# Patient Record
Sex: Male | Born: 1935 | Race: White | Hispanic: No | Marital: Married
Health system: Southern US, Community
[De-identification: ages and names within clinical notes are randomized; demographics above are authoritative.]

---

## 2005-07-13 ENCOUNTER — Ambulatory Visit: Payer: Self-pay | Admitting: Internal Medicine

## 2005-12-03 ENCOUNTER — Ambulatory Visit: Payer: Self-pay | Admitting: Internal Medicine

## 2007-09-25 ENCOUNTER — Other Ambulatory Visit: Payer: Self-pay

## 2007-09-25 ENCOUNTER — Ambulatory Visit: Payer: Self-pay | Admitting: Ophthalmology

## 2007-10-02 ENCOUNTER — Ambulatory Visit: Payer: Self-pay | Admitting: Ophthalmology

## 2008-02-25 ENCOUNTER — Other Ambulatory Visit: Payer: Self-pay

## 2008-02-25 ENCOUNTER — Emergency Department: Payer: Self-pay | Admitting: Emergency Medicine

## 2008-03-11 ENCOUNTER — Ambulatory Visit: Payer: Self-pay | Admitting: Internal Medicine

## 2008-10-24 ENCOUNTER — Ambulatory Visit: Payer: Self-pay | Admitting: Urology

## 2008-10-30 ENCOUNTER — Ambulatory Visit: Payer: Self-pay | Admitting: Urology

## 2008-10-31 ENCOUNTER — Ambulatory Visit: Payer: Self-pay | Admitting: Urology

## 2010-06-23 ENCOUNTER — Ambulatory Visit: Payer: Self-pay | Admitting: Internal Medicine

## 2010-07-05 ENCOUNTER — Ambulatory Visit: Payer: Self-pay | Admitting: Internal Medicine

## 2010-07-21 ENCOUNTER — Ambulatory Visit: Payer: Self-pay | Admitting: Pain Medicine

## 2010-08-10 ENCOUNTER — Ambulatory Visit: Payer: Self-pay | Admitting: Pain Medicine

## 2010-08-18 ENCOUNTER — Ambulatory Visit: Payer: Self-pay | Admitting: Pain Medicine

## 2010-08-24 ENCOUNTER — Ambulatory Visit: Payer: Self-pay | Admitting: Internal Medicine

## 2010-09-01 ENCOUNTER — Ambulatory Visit: Payer: Self-pay | Admitting: Pain Medicine

## 2010-09-14 ENCOUNTER — Ambulatory Visit: Payer: Self-pay | Admitting: Pain Medicine

## 2011-01-29 ENCOUNTER — Other Ambulatory Visit (HOSPITAL_COMMUNITY): Payer: Self-pay | Admitting: Neurological Surgery

## 2011-01-29 ENCOUNTER — Ambulatory Visit (HOSPITAL_COMMUNITY)
Admission: RE | Admit: 2011-01-29 | Discharge: 2011-01-29 | Disposition: A | Discharge status: Home / Self Care | Payer: Medicare Other | Source: Ambulatory Visit | Attending: Neurological Surgery | Admitting: Neurological Surgery

## 2011-01-29 ENCOUNTER — Encounter (HOSPITAL_COMMUNITY)
Admission: RE | Admit: 2011-01-29 | Discharge: 2011-01-29 | Disposition: A | Discharge status: Home / Self Care | Payer: Medicare Other | Source: Ambulatory Visit | Attending: Neurological Surgery | Admitting: Neurological Surgery

## 2011-01-29 DIAGNOSIS — Z01812 Encounter for preprocedural laboratory examination: Secondary | ICD-10-CM | POA: Insufficient documentation

## 2011-01-29 DIAGNOSIS — Z01818 Encounter for other preprocedural examination: Secondary | ICD-10-CM | POA: Insufficient documentation

## 2011-01-29 DIAGNOSIS — M48061 Spinal stenosis, lumbar region without neurogenic claudication: Secondary | ICD-10-CM

## 2011-01-29 DIAGNOSIS — Z01810 Encounter for preprocedural cardiovascular examination: Secondary | ICD-10-CM | POA: Insufficient documentation

## 2011-01-29 LAB — BASIC METABOLIC PANEL
CO2: 32 mEq/L (ref 19–32)
Chloride: 103 mEq/L (ref 96–112)
Potassium: 4.4 mEq/L (ref 3.5–5.1)
Sodium: 143 mEq/L (ref 135–145)

## 2011-01-29 LAB — CBC
HCT: 42.6 % (ref 39.0–52.0)
Hemoglobin: 14.5 g/dL (ref 13.0–17.0)
MCV: 89.9 fL (ref 78.0–100.0)
RDW: 12.7 % (ref 11.5–15.5)
WBC: 7.4 10*3/uL (ref 4.0–10.5)

## 2011-01-29 LAB — PROTIME-INR: INR: 1.04 (ref 0.00–1.49)

## 2011-01-29 LAB — SURGICAL PCR SCREEN
MRSA, PCR: NEGATIVE
Staphylococcus aureus: NEGATIVE

## 2011-01-29 LAB — DIFFERENTIAL
Eosinophils Relative: 2 % (ref 0–5)
Lymphocytes Relative: 27 % (ref 12–46)
Lymphs Abs: 2 10*3/uL (ref 0.7–4.0)
Monocytes Absolute: 1 10*3/uL (ref 0.1–1.0)
Neutro Abs: 4.3 10*3/uL (ref 1.7–7.7)

## 2011-01-29 LAB — APTT: aPTT: 30 seconds (ref 24–37)

## 2011-02-10 ENCOUNTER — Inpatient Hospital Stay (HOSPITAL_COMMUNITY)
Admission: RE | Admit: 2011-02-10 | Discharge: 2011-02-11 | DRG: 491 | Disposition: A | Discharge status: Home / Self Care | Payer: Medicare Other | Source: Ambulatory Visit | Attending: Neurological Surgery | Admitting: Neurological Surgery

## 2011-02-10 ENCOUNTER — Observation Stay (HOSPITAL_COMMUNITY): Payer: Medicare Other

## 2011-02-10 DIAGNOSIS — I10 Essential (primary) hypertension: Secondary | ICD-10-CM | POA: Diagnosis present

## 2011-02-10 DIAGNOSIS — M48061 Spinal stenosis, lumbar region without neurogenic claudication: Principal | ICD-10-CM | POA: Diagnosis present

## 2011-02-10 DIAGNOSIS — Z01812 Encounter for preprocedural laboratory examination: Secondary | ICD-10-CM

## 2011-02-10 DIAGNOSIS — E119 Type 2 diabetes mellitus without complications: Secondary | ICD-10-CM | POA: Diagnosis present

## 2011-02-10 DIAGNOSIS — Z01810 Encounter for preprocedural cardiovascular examination: Secondary | ICD-10-CM

## 2011-02-22 NOTE — Op Note (Signed)
NAMELITTLE, Johnathan NO.:  [id number]  MEDICAL RECORD NO.:  [id number]  LOCATION:  3533                         FACILITY:  [facility]  PHYSICIAN:  Tia Alert, MD     DATE OF BIRTH:  Jan 21, 1930  DATE OF PROCEDURE:  02/10/2011 DATE OF DISCHARGE:  01/29/2011                              OPERATIVE REPORT   PREOPERATIVE DIAGNOSIS:  Severe lumbar spinal stenosis at L2-L3, L3-L4, L4-L5 with leg pain and weakness.  POSTOPERATIVE DIAGNOSES:  Severe lumbar spinal stenosis at L2-L3, L3-L4, L4-L5 with leg pain and weakness.  PROCEDURES:  Decompressive lumbar laminectomy, bilateral medial facetectomies and foraminotomies at L2-L3, L3-L4, L4-5 for decompression of the L3, L4, L5 nerve roots and central canal decompression.  SURGEON:  Tia Alert, M.D.  ASSISTANT:  Donalee Citrin, MD  ANESTHESIA:  General endotracheal.  COMPLICATIONS:  None apparent.  INDICATIONS FOR PROCEDURE:  Johnathan Perkins is a 77 year old gentleman whopresented with back pain radiating into the right leg with bilateral leg weakness.  He had a history of lumbar spinal stenosis.  He had an MRI, which showed severe spinal stenosis at L2-L3, L3-L4, L4-L5.  I recommended a decompressive laminectomy at L2-L3, L3-L4, L4-L5 in hopes of improving his pain syndrome and weakness.  He understood the risks, benefits, and expected outcome and wished to proceed.  DESCRIPTION OF PROCEDURE:  The patient was taken to the operating room and after induction of adequate generalized endotracheal anesthesia, the patient was rolled into the prone position on Wilson frame and all pressure points were padded.  His lumbar region was prepped with DuraPrep and draped in the usual sterile fashion.  An 8 mL of local anesthesia injected.  A dorsal midline incision made and carried down to lumbosacral fascia.  The fascia was opened and the paraspinous musculature was taken in subperiosteal fashion to expose the L2-L3, L3- L4,  L4-5.  Intraoperative x-ray confirmed my level and then I removed the spinous processes of L3-L4 as well as the inferior part of the spinous process of L2.  I then used a combination of high-speed drill and Kerrison punches to perform complete laminectomies, medial facetectomies and foraminotomies at L3-L4 with partial laminectomy bilaterally at L2.  I used Kerrison punch to undercut the lateral recesses to remove the yellow ligament from the lateral recesses until I was lateral to the dura.  I identified the medial pedicle wall and skeletonized the pedicles of L3, L4, L5, identified the nerve roots and decompressed them distally into their respective foramina undercutting the lateral recess to remove yellow ligament and overgrown facet from the surface of the nerve roots.  Once my decompression was complete, the central canal was well decompressed.  I could pass the coronary dilator easily into the lateral recess along the nerve roots.  I felt like I had adequate decompression.  Then based on visualization and palpation, I irrigated with saline solution and bacitracin.  Dried all bleeding points with bipolar cautery and Surgifoam.  We irrigated this away, lined the dura with Gelfoam, placed a medium Hemovac drain through a separate stab incision then closed the muscle and fascia with 0 Vicryl closing subcutaneous and subcuticular tissues with 2-0 and 3-0 Vicryl  and closed the skin with Benzoin and Steri-Strips.  The drapes were removed.  Sterile dressing was applied.  The patient awakened from anesthesia and transferred recovery room in stable condition.  At the end of the procedure, all sponge, needle, and instrument counts were correct.     Tia Alert, MD     DSJ/MEDQ  D:  02/10/2011  T:  02/10/2011  Job:  [id number]  Electronically Signed by Marikay Alar MD on 02/22/2011 01:13:26 PM

## 2011-07-05 ENCOUNTER — Ambulatory Visit: Payer: Self-pay | Admitting: General Practice

## 2011-07-05 LAB — URINALYSIS, COMPLETE
Bilirubin,UR: NEGATIVE
Blood: NEGATIVE
Ketone: NEGATIVE
Ph: 5 (ref 4.5–8.0)
RBC,UR: <1 /HPF (ref 0–5)
Specific Gravity: 1.026 (ref 1.003–1.030)
Squamous Epithelial: NONE SEEN

## 2011-07-05 LAB — BASIC METABOLIC PANEL
Calcium, Total: 9.2 mg/dL (ref 8.5–10.1)
Creatinine: 0.93 mg/dL (ref 0.60–1.30)
EGFR (Non-African Amer.): >60
Glucose: 113 mg/dL — ABNORMAL HIGH (ref 65–99)
Sodium: 145 mmol/L (ref 136–145)

## 2011-07-05 LAB — APTT: Activated PTT: 30.7 secs (ref 23.6–35.9)

## 2011-07-05 LAB — SEDIMENTATION RATE: Erythrocyte Sed Rate: 6 mm/hr (ref 0–20)

## 2011-07-05 LAB — CBC
HCT: 40.5 % (ref 40.0–52.0)
MCHC: 33.5 g/dL (ref 32.0–36.0)
RDW: 14.2 % (ref 11.5–14.5)

## 2011-07-05 LAB — PROTIME-INR: INR: 1

## 2011-07-21 ENCOUNTER — Inpatient Hospital Stay: Payer: Self-pay | Admitting: General Practice

## 2011-07-22 LAB — BASIC METABOLIC PANEL
Calcium, Total: 7.9 mg/dL — ABNORMAL LOW (ref 8.5–10.1)
Co2: 25 mmol/L (ref 21–32)
EGFR (African American): >60
EGFR (Non-African Amer.): >60
Glucose: 132 mg/dL — ABNORMAL HIGH (ref 65–99)
Osmolality: 281 (ref 275–301)
Potassium: 5 mmol/L (ref 3.5–5.1)
Sodium: 138 mmol/L (ref 136–145)

## 2011-07-22 LAB — HEMOGLOBIN: HGB: 11 g/dL — ABNORMAL LOW (ref 13.0–18.0)

## 2011-07-23 LAB — BASIC METABOLIC PANEL
Anion Gap: 10 (ref 7–16)
BUN: 17 mg/dL (ref 7–18)
Creatinine: 1.09 mg/dL (ref 0.60–1.30)
EGFR (Non-African Amer.): >60
Glucose: 146 mg/dL — ABNORMAL HIGH (ref 65–99)
Osmolality: 287 (ref 275–301)
Potassium: 4.1 mmol/L (ref 3.5–5.1)

## 2011-07-23 LAB — PLATELET COUNT: Platelet: 152 10*3/uL (ref 150–440)

## 2012-06-07 ENCOUNTER — Ambulatory Visit: Payer: Self-pay | Admitting: Ophthalmology

## 2012-06-19 ENCOUNTER — Ambulatory Visit: Payer: Self-pay | Admitting: Ophthalmology

## 2012-07-25 ENCOUNTER — Ambulatory Visit: Payer: Self-pay | Admitting: Gastroenterology

## 2012-10-29 IMAGING — CT CT ABDOMEN W/ CM
1 of 2 series · 15 of 32 positions shown, 19 images · non-contrast
Comparison: none

REASON FOR EXAM: Wt Loss
COMMENTS:

[Series 2: abd with 5.0 i40f · axial · 0.80mm/px · z∈[-915,-690]mm · 15 of 51 slices shown, 19 images]
[im 3/51  soft-tissue]
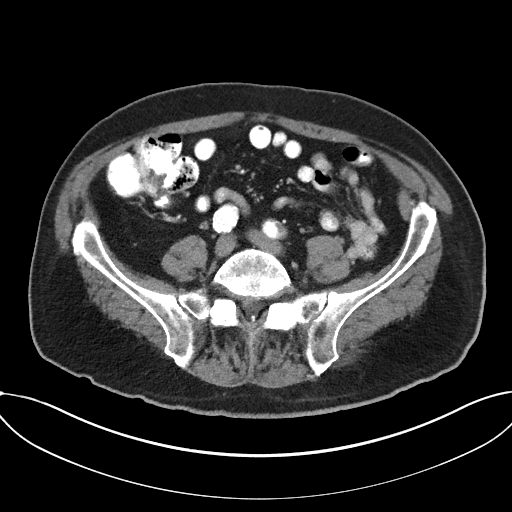
[im 3/51  bone]
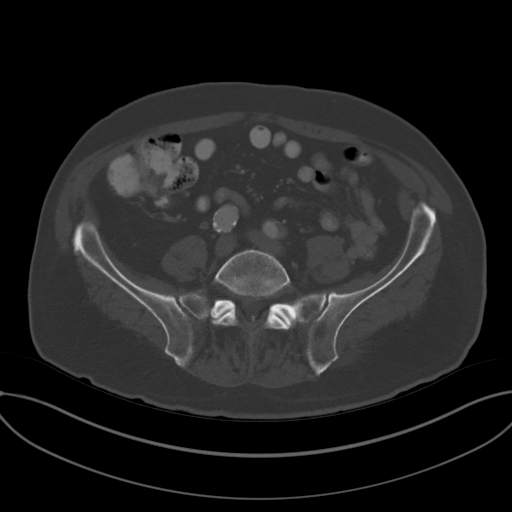
[im 7/51  soft-tissue]
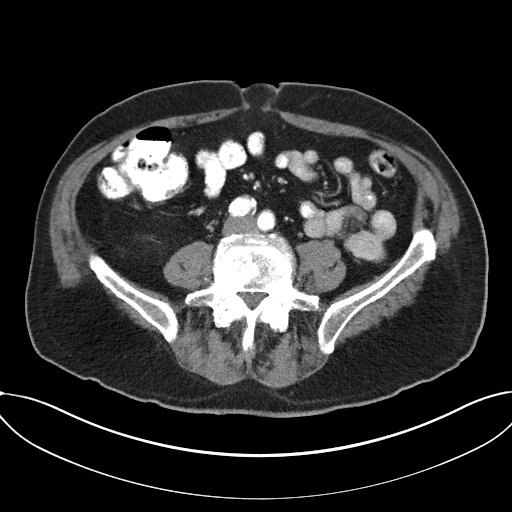
[im 11/51  soft-tissue]
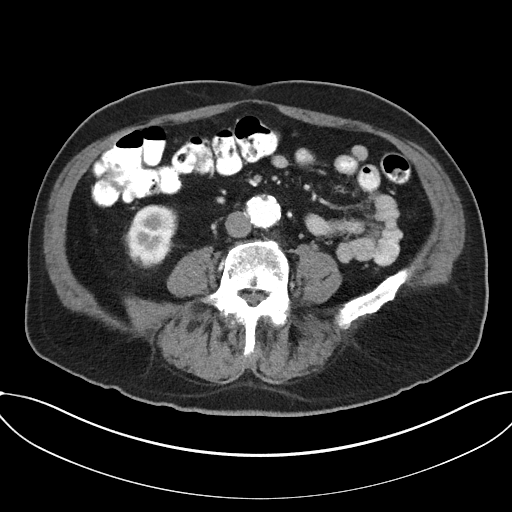
[im 14/51  soft-tissue]
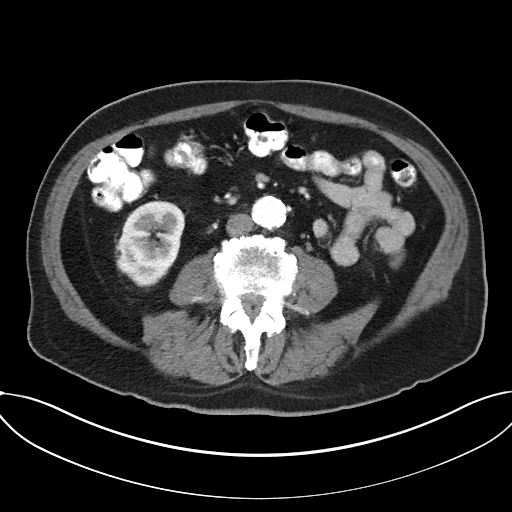
[im 18/51  soft-tissue]
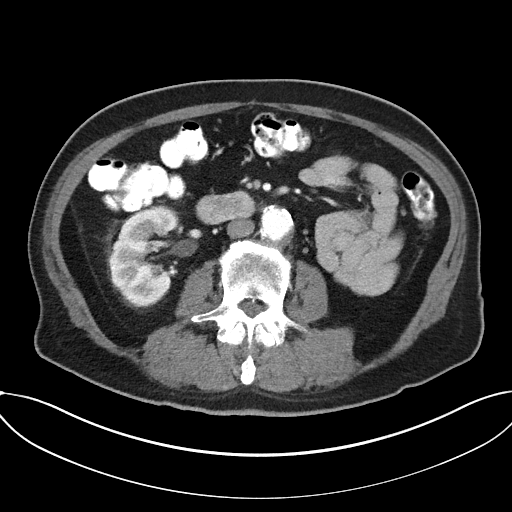
[im 22/51  soft-tissue]
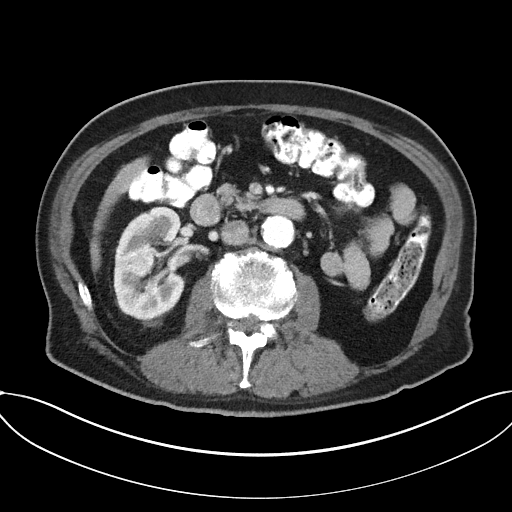
[im 27/51  soft-tissue]
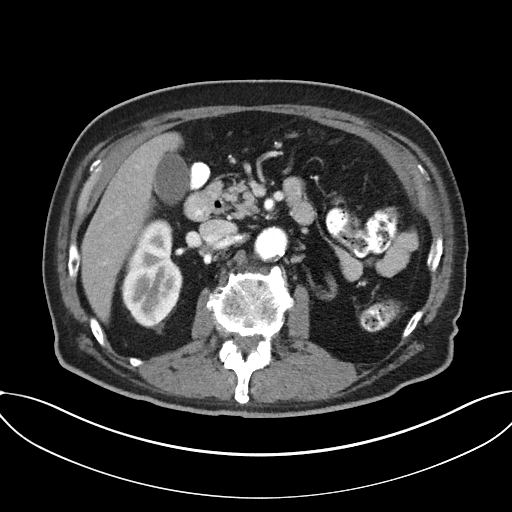
[im 29/51  soft-tissue]
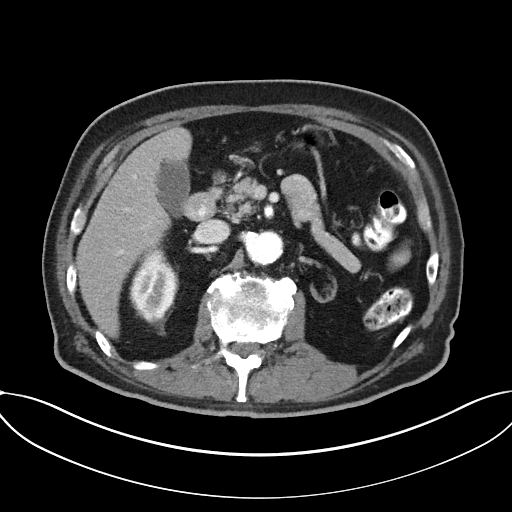
[im 33/51  soft-tissue]
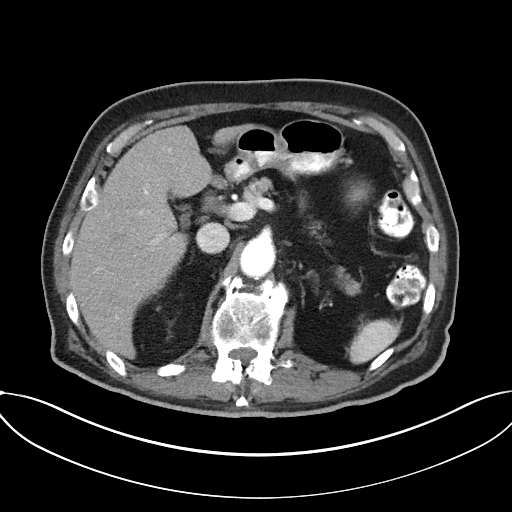
[im 33/51  bone]
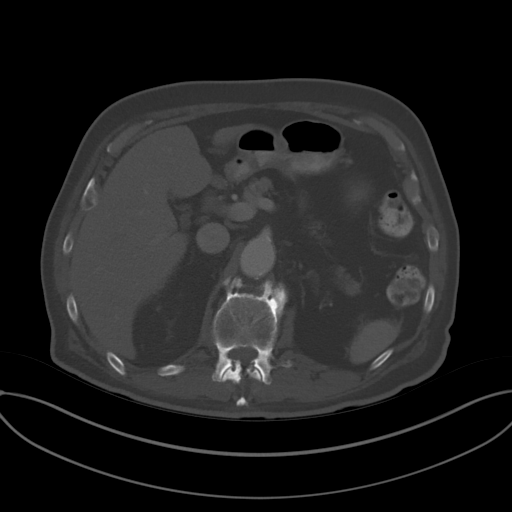
[im 37/51  soft-tissue]
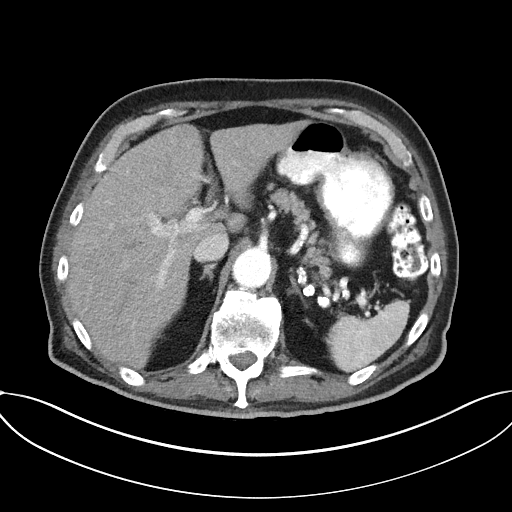
[im 40/51  soft-tissue]
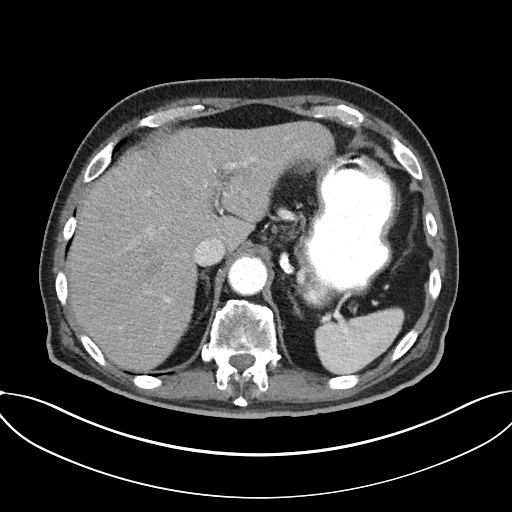
[im 42/51  lung]
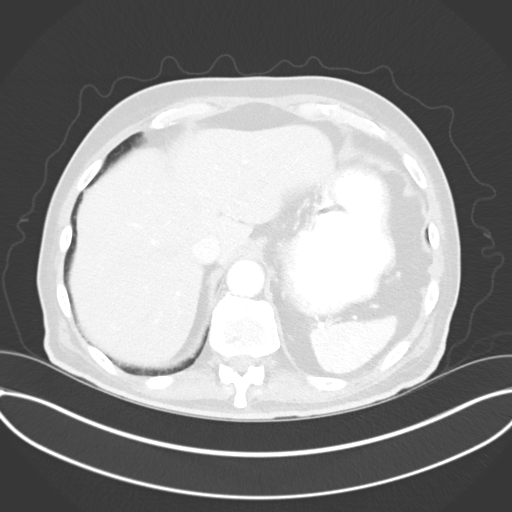
[im 44/51  soft-tissue]
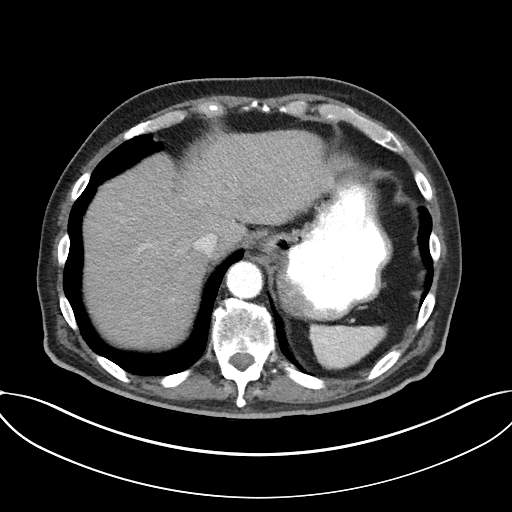
[im 44/51  lung]
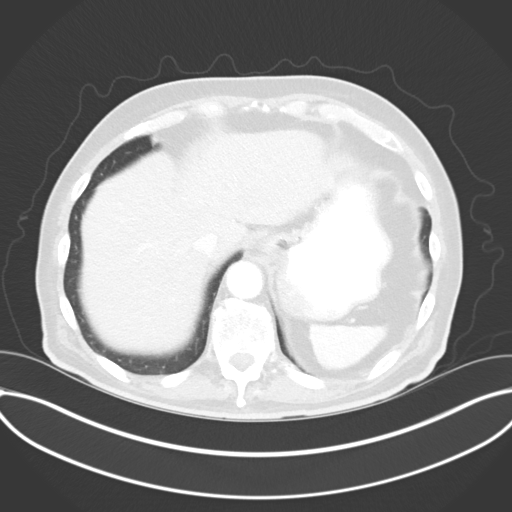
[im 46/51  lung]
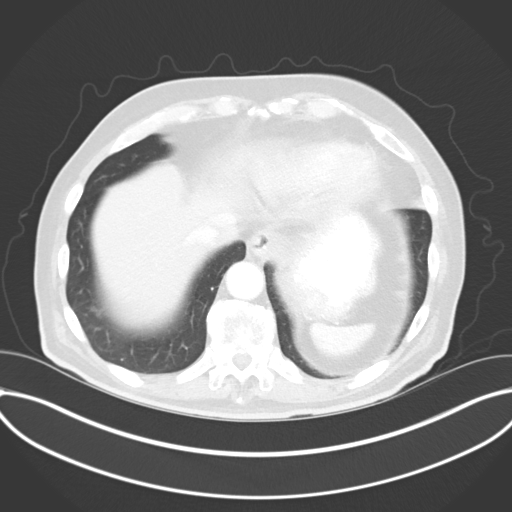
[im 48/51  soft-tissue]
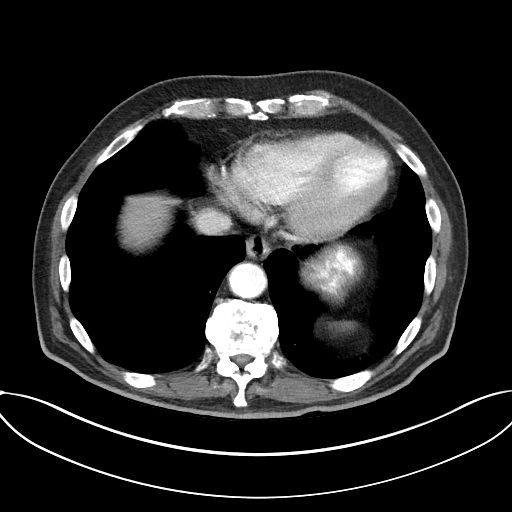
[im 48/51  lung]
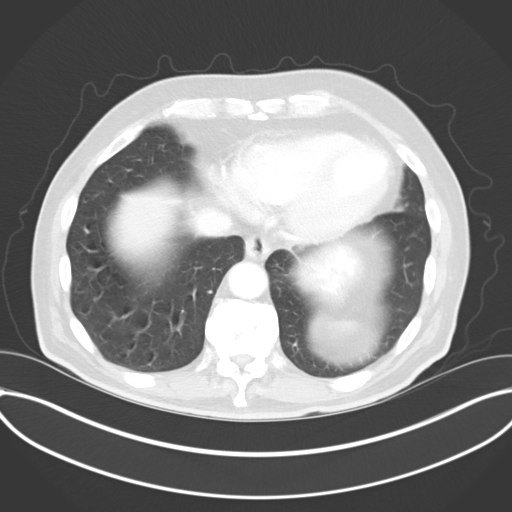

[15 of 32 positions shown; findings below may reference images not displayed]

PROCEDURE:     KCT - KCT ABDOMEN STANDARD W  - August 24, 2010  [DATE]

RESULT:     CT of the abdomen is performed with 85 mL of Psovue-DHH
iodinated intravenous contrast and oral contrast with images reconstructed
at 5 mm slice thickness. The patient has no previous exam for comparison.

Images through the base of the lungs demonstrate no infiltrate, mass or
pleural effusion. The pancreas, liver, spleen, adrenal glands and aorta
appear to be within normal limits with atherosclerotic calcification present
in the aorta. There is a fat filled umbilical hernia present with a hernia
defect measuring approximately 11 mm on image 46. The right kidney appears
to enhance normally. The left kidney is severely atrophied with only a thin
rim of tissue present between images 22 and 26. No abnormal bowel distention
or bowel wall thickening is evident. No adenopathy is demonstrated.
Degenerative bony changes are present in the spine with disc space narrowing
and some hypertrophic degenerative endplate spurring present. No gallstones
are evident.
IMPRESSION: 1. Severely atrophied left kidney.
2. Otherwise, unremarkable exam.

## 2013-02-28 DEATH — deceased

## 2013-09-25 IMAGING — CR RIGHT HIP - COMPLETE 2+ VIEW
1 series · 2 of 2 positions shown · non-contrast
Comparison: none

REASON FOR EXAM: s/p THA
COMMENTS:   Bedside (portable):Y

[Series 1: ap · 0.17mm/px · 2 of 2 slices shown]
[im 1/2]
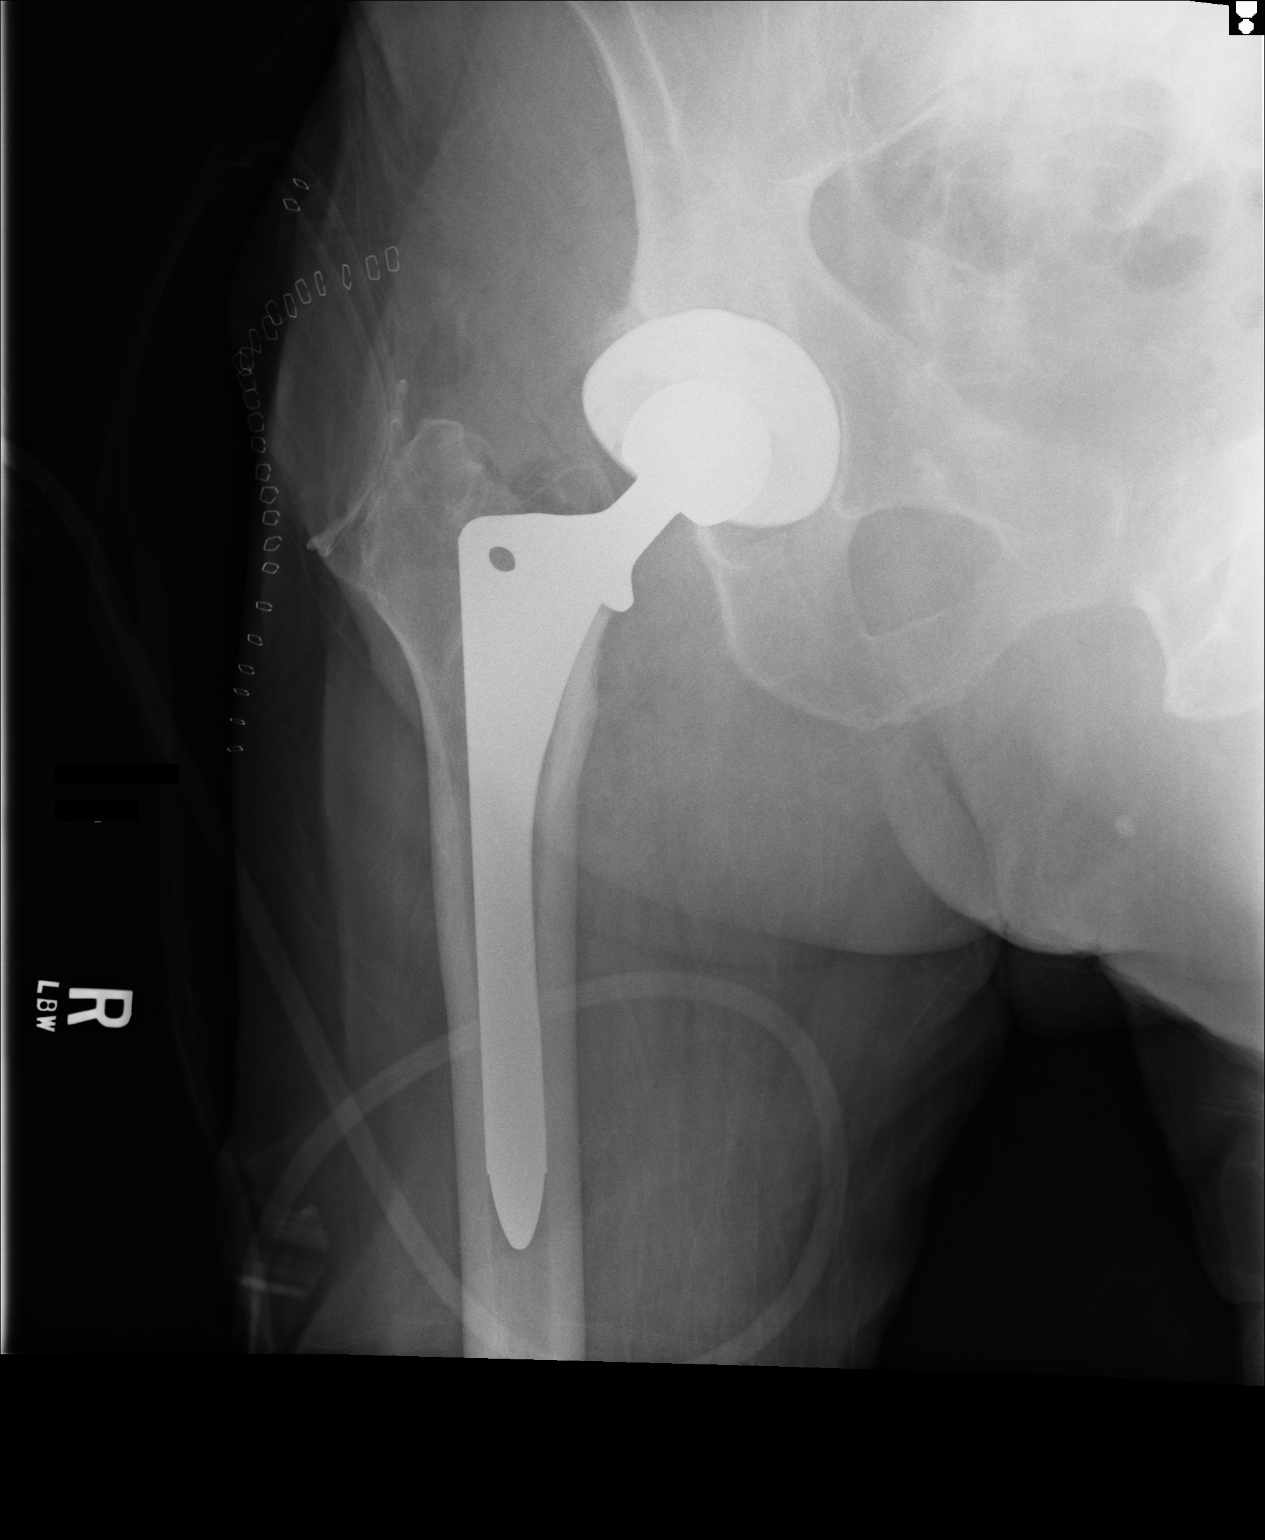
[im 2/2]
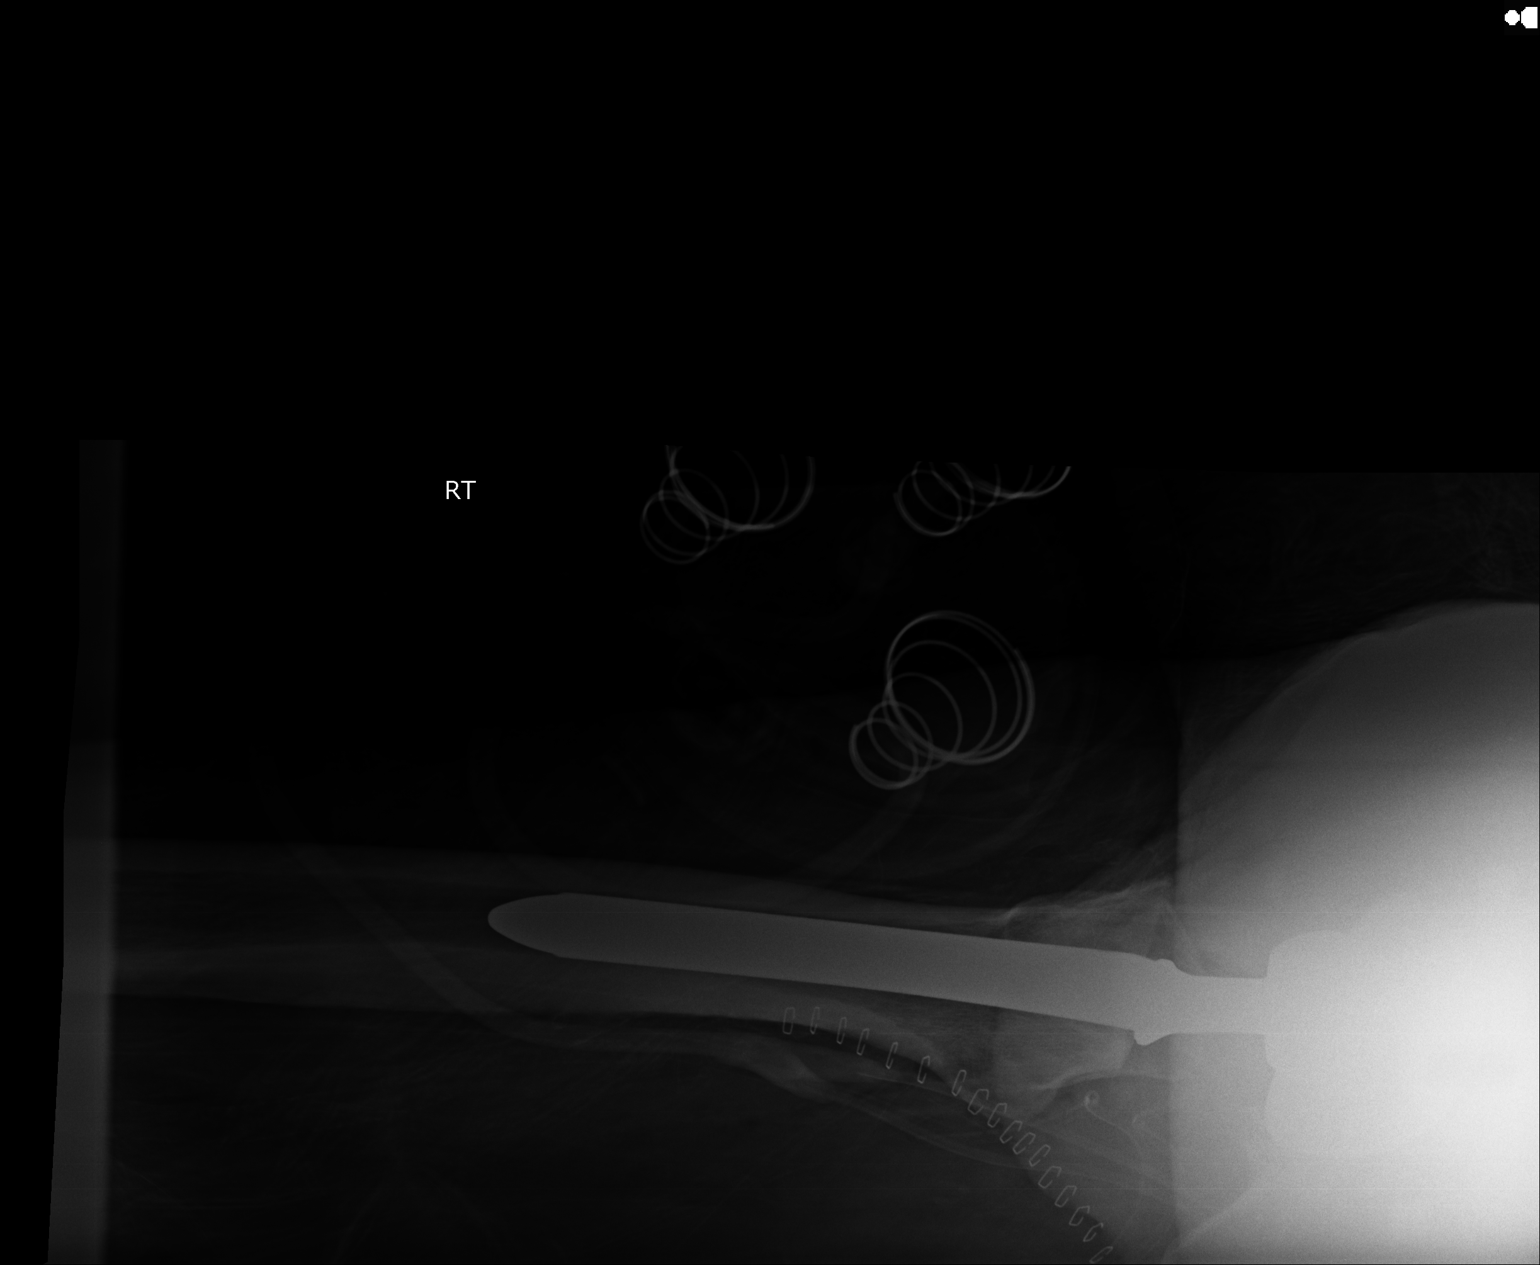

[2 of 2 positions shown; findings below may reference images not displayed]

PROCEDURE:     DXR - DXR HIP RIGHT COMPLETE  - July 21, 2011  [DATE]

RESULT:     The patient is status post right hip replacement. No fracture
about the prosthetic components is seen. The femoral prosthesis shaft is
located centrally in the medullary space of the proximal right femur. There
is no dislocation at the prosthetic hip joint.
IMPRESSION: The patient is status post right hip replacement. No abnormal postoperative
changes are identified.

## 2014-09-20 NOTE — Op Note (Signed)
PATIENT NAME:  Johnathan KocherMAULTSBY, Holly N MR#:  [id number] DATE OF BIRTH:  [birth year]  DATE OF PROCEDURE:  06/19/2012  PREOPERATIVE DIAGNOSIS: Cataract left eye.   POSTOPERATIVE DIAGNOSIS: Cataract left eye.   PROCEDURE PERFORMED: Extracapsular cataract extraction using phacoemulsification with placement of Alcon [id number], 18.0 diopter posterior chamber lens, serial number [id number].   SURGEON: Maylon PeppersSteven A. Vaishnav Demartin, M.D.   ANESTHESIA: Lidocaine 4% and 0.75% Marcaine, a 50-50 mixture with 10 units/mL of Hylenex added given as a peribulbar.   ANESTHESIOLOGIST: Dr. Darleene CleaverVan Staveren.   COMPLICATIONS: None.   ESTIMATED BLOOD LOSS: Less than 1 mL.   DESCRIPTION OF PROCEDURE: The patient was brought to the operating room and given a peribulbar block.  The patient was then prepped and draped in the usual fashion.  The vertical rectus muscles were imbricated using 5-0 silk sutures.  These sutures were then clamped to the sterile drapes as bridle sutures.  A limbal peritomy was performed extending two clock hours and hemostasis was obtained with cautery.  A partial thickness scleral groove was made at the surgical limbus and dissected anteriorly in a lamellar dissection using an Alcon crescent knife.  The anterior chamber was entered supero-temporally with a Superblade and through the lamellar dissection with a 2.6 mm keratome.  DisCoVisc was used to replace the aqueous and a continuous tear capsulorrhexis was carried out.  Hydrodissection and hydrodelineation were carried out with balanced salt and a 27 gauge canula.  The nucleus was rotated to confirm the effectiveness of the hydrodissection.  Phacoemulsification was carried out using a divide-and-conquer technique.  Total ultrasound time was 1 minute and 23 seconds with an average power of 8.6%, CDE of 30.86.    Irrigation/aspiration was used to remove the residual cortex.  DisCoVisc was used to inflate the capsule and the internal incision was enlarged to 3  mm with the crescent knife.  The intraocular lens was folded and inserted into the capsular bag using the AcrySert delivery system.  Irrigation/aspiration was used to remove the residual DisCoVisc.  Miostat was injected into the anterior chamber through the paracentesis track to inflate the anterior chamber and induce miosis.  Cefuroxime 0.1 mL was injected.  The wound was checked for leaks and none were found. The conjunctiva was closed with cautery and the bridle sutures were removed.  Two drops of 0.3% Vigamox were placed on the eye.   An eye shield was placed on the eye.  The patient was discharged to the recovery room in good condition.   ____________________________ Maylon PeppersSteven A. Korbin Mapps, MD sad:cs D: 06/19/2012 13:45:03 ET T: 06/19/2012 14:56:38 ET JOB#: [id number]  cc: Viviann SpareSteven A. Rosellen Lichtenberger, MD, <Dictator> Erline LevineSTEVEN A Lekisha Mcghee MD ELECTRONICALLY SIGNED 06/26/2012 13:19

## 2014-09-22 NOTE — Op Note (Signed)
PATIENT NAME:  Tiffany KocherMAULTSBY, Yasiel N MR#:  [id number] DATE OF BIRTH:  07/18/1929  DATE OF PROCEDURE:  07/21/2011  PREOPERATIVE DIAGNOSIS: Degenerative arthrosis of the right hip.   POSTOPERATIVE DIAGNOSIS: Degenerative arthrosis of the right hip.   PROCEDURE PERFORMED:  1. Right total hip arthroplasty.  2. Bone grafting of acetabular defects.   SURGEON:  Francesco SorJames Hooten, M.D.   ASSISTANT:  Van ClinesJon Wolfe, PA-C (required to maintain retraction throughout the procedure)   ANESTHESIA: Spinal.   ESTIMATED BLOOD LOSS: 500 mL.   FLUIDS REPLACED: 1650 mL.   DRAINS: Two medium drains to Hemovac reservoir.   IMPLANTS UTILIZED: DePuy 15-mm large stature Prodigy femoral component, 66-mm outer diameter Pinnacle Sector 2 acetabular component, +4-mm 10-degree Pinnacle Marathon acetabular liner, and a 36-mm aSphere femoral head with a + 8.5-mm neck length.   INDICATIONS FOR SURGERY: The patient is a 81 year old male who has been seen for complaints of progressive right hip and groin pain. He has noted progressive loss of his right hip range of motion. X-rays demonstrated severe degenerative changes with bone on bone articulation noted and evidence of subchondral sclerosis and subchondral cyst formation. After a discussion of the risks and benefits of surgical intervention, the patient expressed his understanding of the risks and benefits and agreed with plans for surgical intervention.   PROCEDURE IN DETAIL: The patient was brought to the operating room and, after adequate spinal anesthesia was achieved, the patient was placed in the left lateral decubitus position. Axillary roll was placed and all bony prominences were well padded. The patient's right hip and leg were cleaned and prepped with alcohol and DuraPrep draped in the usual sterile fashion. A "time-out" was performed per usual protocol. A lateral curvilinear incision was made gently curving towards the posterior superior iliac spine. IT band was incised in  line with the skin incision and the fibers of the gluteus maximus were split in line. The piriformis tendon was identified, skeletonized, and incised at its insertion at the proximal femur and reflected posteriorly. In similar fashion, short external rotators were incised and reflected posteriorly. A T-type posterior capsulotomy was performed. Prior to dislocation of the femoral head, a threaded Steinmann pin was inserted into the pelvis superior to the acetabulum and bent in the form of a stylus so as to assess limb length and hip offset throughout the procedure. The femoral head was then dislocated posteriorly. The femoral neck cut was performed using an oscillating saw. Inspection of the femoral head demonstrated severe degenerative changes with full thickness loss of articular cartilage especially to the superior aspect of the femoral head. The femoral head was subsequently submitted to pathology. The anterior capsule was elevated off of the femoral neck. Inspection of the acetabulum demonstrated severe degenerative changes with large cystic lesions noted. A remnant of the labrum was excised. The acetabulum was reamed in a sequential fashion up to a 65-mm diameter. This allowed for good punctate bleeding bone.  Three large cystic areas were debrided and curettaged. 20 mL of cancellous bone was then mixed with blood from the wound and used to impact into the cystic lesions. The 65-mm reamer was then placed in the acetabulum and reverse reaming was performed so as to allow for impaction grafting of the lesions. A 66-mm outer diameter Pinnacle Sector 2 acetabular component was positioned and impacted into place. Good scratch fit was appreciated.  A +4 neutral trial was initially placed and attention was directed to the proximal femur. A pilot hole for reaming of the  proximal femoral canal was created using a high-speed bur.  The proximal femoral canal was reamed in a sequential fashion up to a 14.5-mm diameter.  This allowed for approximately 6 cm of scratch fit. The proximal portion of the femur was then prepared using a 15-mm aggressive side-biting reamer. Serial broaches were inserted up to a 15-mm large stature broach. The calcar region was planed and trial reduction was performed using first an AML head and neck segment. Reasonably good stability was appreciated but it was elected to trial with a +4 liner with a 10-degree slope with the high side positioned at approximately the eight o'clock position. AML and subsequently Prodigy neck segment were trialed with first a +5 and subsequently a +8.5-mm neck length. This allowed for good restoration of the hip offset and equalization of limb lengths. Trial implants were removed. A +4-mm 10-degree Pinnacle Marathon polyethylene insert was positioned with the high side at approximately the eight o'clock position and impacted into place. Next, a 15-mm large stature Prodigy femoral component was positioned and impacted into place. Excellent scratch fit was appreciated. Trial reduction was again performed using the +8.5-mm neck length. Again, this allowed for excellent anterior and posterior stability, good equalization of limb lengths, and excellent restoration of hip offset. Trial hip ball was removed. The Morse  taper was cleaned and dried. A 36-mm M-Spec aSphere femoral head with a +8.5-mm neck length was placed on the trunnion and impacted into place. The hip was reduced and again and good equalization of limb lengths and excellent stability were appreciated.   The wound was irrigated with copious amounts of normal saline with antibiotic solution using pulsatile lavage and then suctioned dry. Good hemostasis was appreciated. The posterior capsulotomy was repaired using #5 Ethibond. The piriformis tendon was reapproximated on the undersurface of the gluteus medius tendon using #5 Ethibond. Two medium drains were placed in the wound bed and brought out through separate stab  incisions to be attached to a Hemovac reservoir. The IT band was repaired using interrupted sutures of #1 Vicryl. The subcutaneous tissue was approximated in layers using first #0 Vicryl followed by #2-0 Vicryl. Skin was closed with skin staples. A sterile dressing was applied.   The patient tolerated the procedure well. He was transported to the recovery room in stable condition.     ____________________________ Illene Labrador. Angie Fava., MD jph:bjt D: 07/22/2011 06:35:39 ET T: 07/22/2011 10:08:51 ET JOB#: [id number]  cc: Illene Labrador. Angie Fava., MD, <Dictator> Illene Labrador Angie Fava MD ELECTRONICALLY SIGNED 07/22/2011 19:07

## 2014-09-22 NOTE — Discharge Summary (Signed)
PATIENT NAME:  Johnathan KocherMAULTSBY, Melvern N MR#:  [id number] DATE OF BIRTH:  [birth year]  DATE OF ADMISSION:  07/21/2011 DATE OF DISCHARGE:  07/24/2011  ADMITTING DIAGNOSIS: Degenerative arthrosis of the right hip.   DISCHARGE DIAGNOSIS: Degenerative arthrosis of the right hip.  HISTORY: The patient is a pleasant 81 year old gentleman who [facility] of right hip and groin pain. He had reported a several year history of discomfort to this region. He had also appreciated progressive decrease in his range of motion of the hip. The pain was noted to be aggravated with weight bearing activities as well as extreme rotation. At the time of surgery, he was not using any type of ambulatory aid. He was ambulating with a significant limp and did report some crepitus. The patient had not seen any significant improvement despite physical therapy following lumbar laminectomy. He had also not seen any improvement despite the use of anti-inflammatory medications. He states that the pain had increased to the point that it was significantly interfering with his activities of daily living. X-rays taken in the clinic showed flattening of the superior aspect of the femoral head with bone-on-bone articulation noted. Subchondral cyst formation as well as subchondral sclerosis was noted. After discussion of the risks and benefits of surgical intervention, the patient expressed his understanding of the risks and benefits and agreed with plans for surgical intervention.   PROCEDURES:  1. Right total hip arthroplasty. 2. Bone grafting of acetabulum defect.   ANESTHESIA: Spinal.   IMPLANTS UTILIZED: DePuy 15 mm large statue Prodigy femoral component, 56 mm outer diameter Pinnacle sector-2 acetabular component, +4 mm 10 degree Pinnacle Marathon acetabular liner, and a 36 mm ASPHERE femoral head with a +8.5 mm neck length.   HOSPITAL COURSE: The patient tolerated the procedure very well. He had no  complications. He was then taken to the PAC-U where he was stabilized and then transferred to the orthopedic floor. The patient began receiving anticoagulation therapy of Lovenox 30 mg subcutaneous every 12 hours per anesthesia protocol. He was fitted with TED stockings bilaterally. These were allowed to be removed one hour per eight hour shift. The patient was also fitted with the AV-I compression foot pumps set at 80 mmHg. He has had no evidence of any deep venous thromboses. Calves have been nontender. Negative Homans sign. His feet were elevated off the bed using rolled towels. The heels have been nontender with no tissue breakdown noted.   The patient has denied any chest pain or shortness of breath. Vital signs have been stable. He has been afebrile. Hemodynamically he was stable and no transfusions were given. Laboratory studies following surgery were within normal limits.   Physical therapy was initiated on day one for gait training and transfers. On day one the patient was noted to ambulate greater than 200 feet. He continued to do this throughout the hospital course. Upon being discharged, he was able go up and down four sets of steps. He was independent with bed to chair transfers. Occupational therapy was also initiated on day one for activities of daily living and assistive devices.   The patient's IV, Foley and Hemovac were all discontinued on day two along with the dressing change. The wound was free of any drainage or any signs of infection.   DISPOSITION: The patient is being discharged to home in improved stable condition.   DISCHARGE INSTRUCTIONS:  1. He is to continue wearing TED stockings during the day, but he may remove  these at night.  2. He is to continue using a walker until cleared by physical therapy to go to a quad cane. He may weight bear as tolerated. 3. Hip precautions were once again gone over.  4. He has a follow-up appointment on 09/02/2011, sooner if any temperatures  of 101.5 or greater, or excessive bleeding.  5. The patient will receive home health physical therapy.  6. He is to have staples removed on 08/04/2011 and apply benzoin and Steri-Strips.  7. He may take a shower after the staples are removed.  8. He is to resume his regular medication that he was on prior to admission. He was also given a prescription for Lovenox 40 mg subcutaneously daily for 14 days, then discontinue and begin taking one 81 mg enteric coated aspirin per day. He was also given a prescription for oxycodone 1 to 2 tablets every 4 to 6 hours p.r.n. for pain.   PAST MEDICAL HISTORY:  1. Hypertension.  2. Hypercholesterolemia.  3. Hypothyroidism.  4. Hemorrhoids.  5. Kidney stones.  6. Cataracts. 7. Spinal stenosis.  8. Aortic stenosis. 9. Murmur.  10. Bell's palsy. 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 ____________________________ Van Clines, PA jrw:slb D: 07/24/2011 07:43:50 ET T: 07/24/2011 14:40:01 ET JOB#: [id number]  cc: Van Clines, PA, <Dictator> Malone Vanblarcom PA ELECTRONICALLY SIGNED 07/24/2011 20:06
